# Patient Record
Sex: Male | Born: 1970 | Race: White | Hispanic: No
Health system: Southern US, Community
[De-identification: ages and names within clinical notes are randomized; demographics above are authoritative.]

---

## 2009-05-08 ENCOUNTER — Encounter: Admission: RE | Admit: 2009-05-08 | Discharge: 2009-05-08 | Payer: Self-pay | Admitting: Family Medicine

## 2010-09-02 IMAGING — CR DG ORBITS FOR FOREIGN BODY
2 series · 2 of 2 positions shown · non-contrast
Comparison: None

CLINICAL DATA: History of working around metal.  Screening exam
prior to a MRI scan.

ORBITS FOR FOREIGN BODY - 2 VIEW

[view not recorded (1 of 2)]
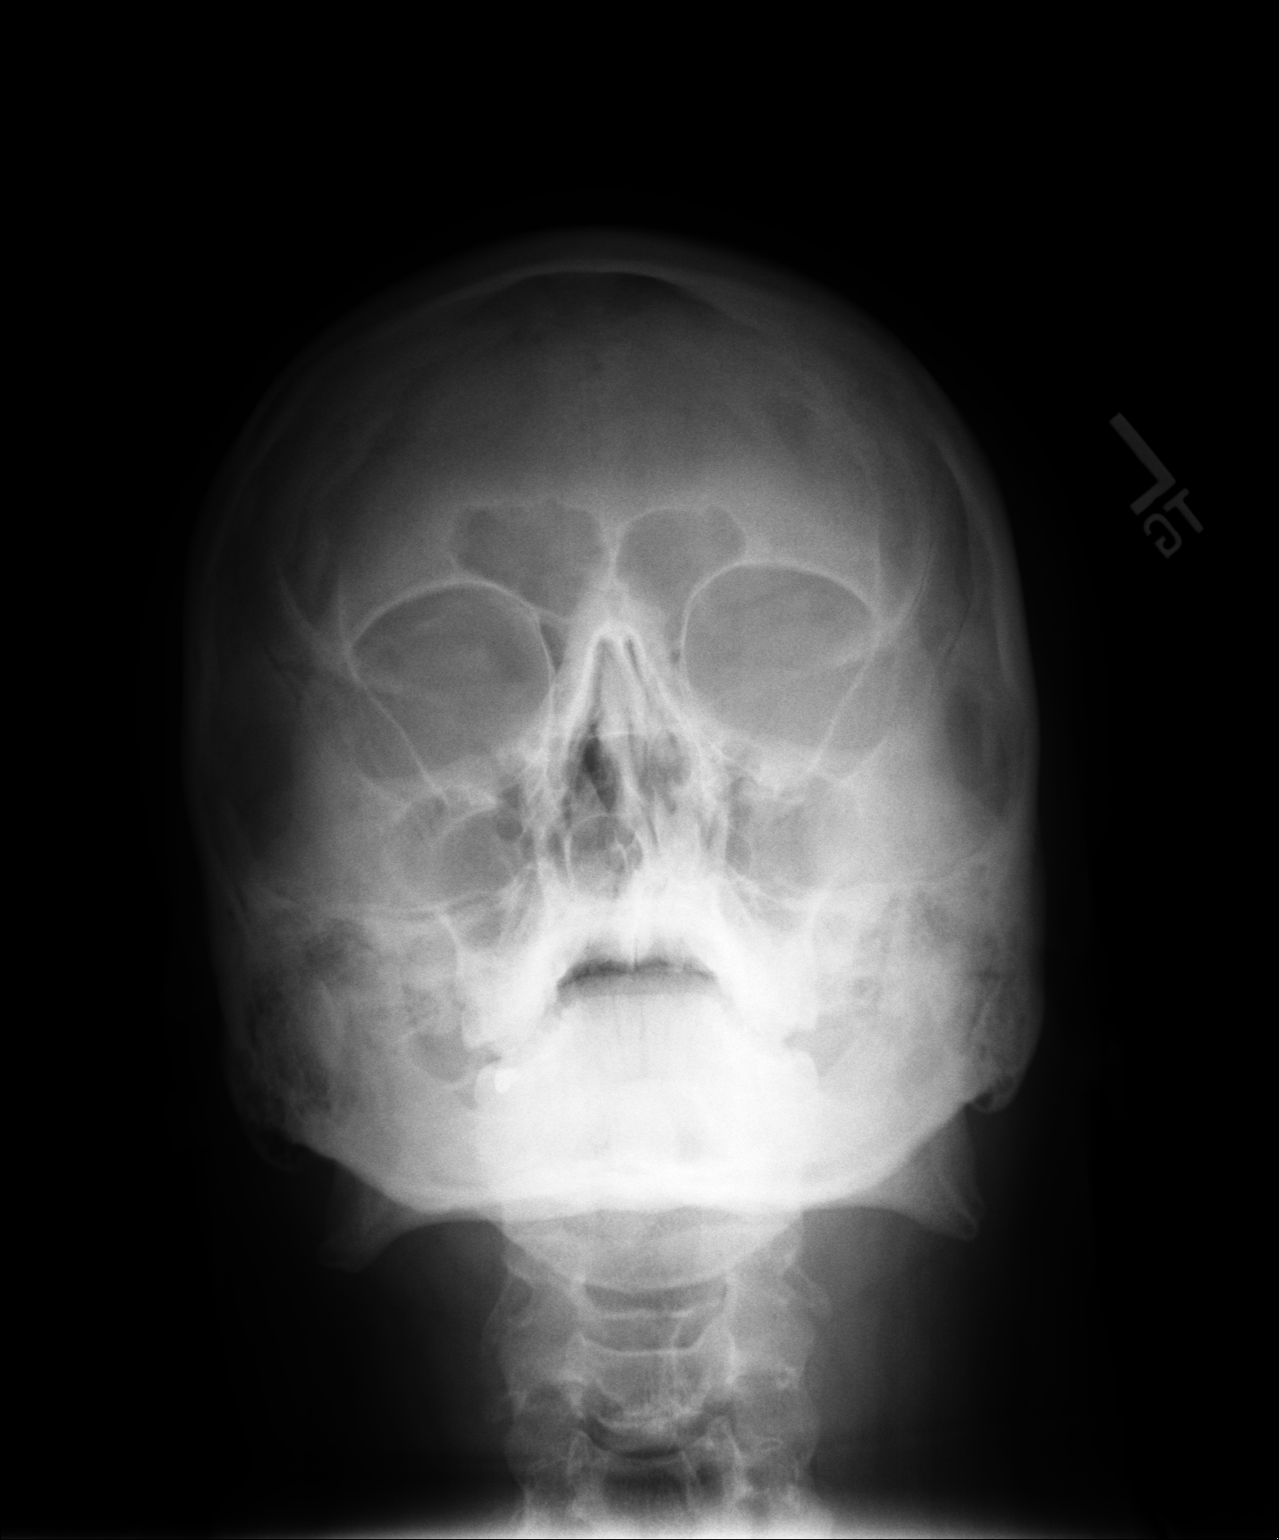

[view not recorded (2 of 2)]
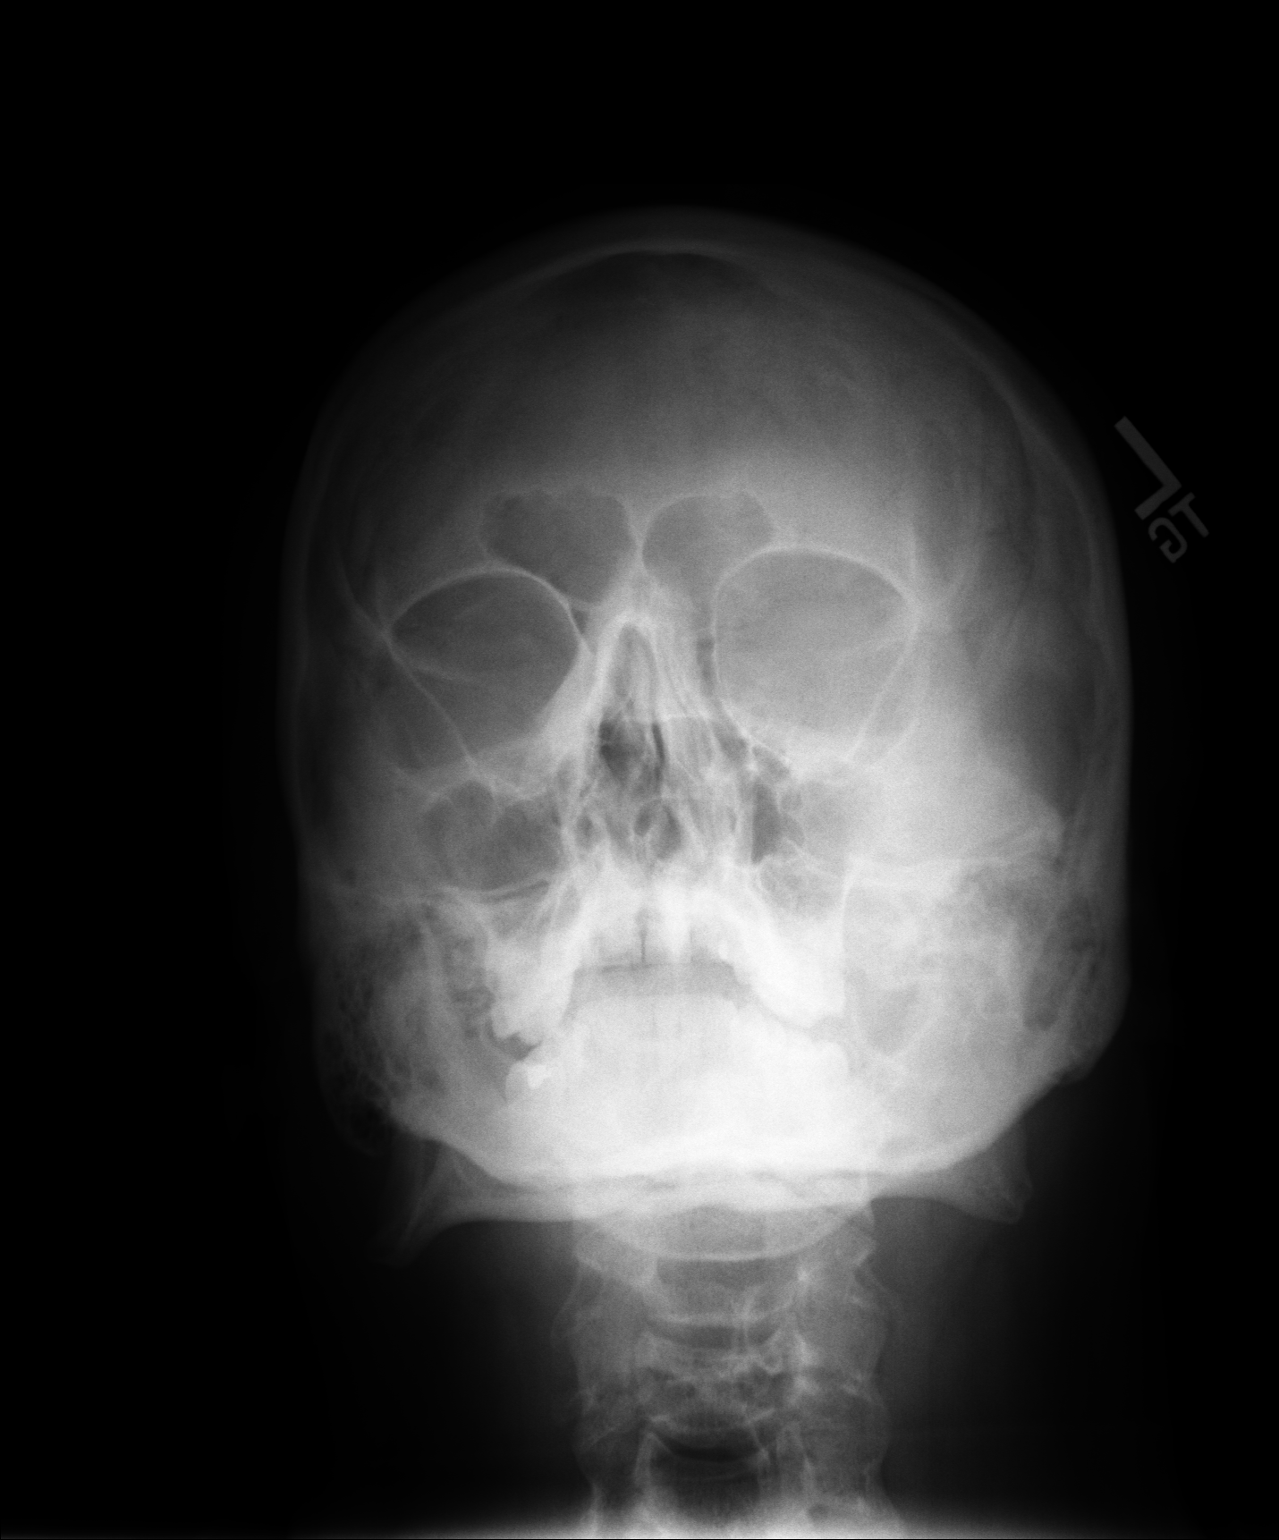

[2 of 2 positions shown; findings below may reference images not displayed]

FINDINGS: No evidence for metallic densities overlying the orbits.
There is increased opacification over the left maxillary sinus
compared to the right side which may represent underlying mucosal
disease.
IMPRESSION: Negative for a metallic foreign body.

Possible left maxillary sinus disease.

## 2014-11-15 ENCOUNTER — Emergency Department
Admission: EM | Admit: 2014-11-15 | Discharge: 2014-11-15 | Disposition: A | Discharge status: Home / Self Care | Payer: BC Managed Care – PPO | Source: Home / Self Care | Attending: Family Medicine | Admitting: Family Medicine

## 2014-11-15 ENCOUNTER — Emergency Department (INDEPENDENT_AMBULATORY_CARE_PROVIDER_SITE_OTHER): Payer: BC Managed Care – PPO

## 2014-11-15 DIAGNOSIS — S6992XA Unspecified injury of left wrist, hand and finger(s), initial encounter: Secondary | ICD-10-CM

## 2014-11-15 DIAGNOSIS — R52 Pain, unspecified: Secondary | ICD-10-CM

## 2014-11-15 DIAGNOSIS — M7989 Other specified soft tissue disorders: Secondary | ICD-10-CM

## 2014-11-15 MED ORDER — TETANUS-DIPHTH-ACELL PERTUSSIS 5-2.5-18.5 LF-MCG/0.5 IM SUSP
0.5000 mL | Freq: Once | INTRAMUSCULAR | Status: AC
  Administered 2014-11-15: 0.5 mL via INTRAMUSCULAR

## 2014-11-15 NOTE — ED Provider Notes (Signed)
Travis SongsterKarl Macias is a 43 y.o. male who presents to Urgent Care today for finger injury. Patient got his left ring finger ring caught on an object today results in an axial traction on his finger. He notes pain and swelling and a laceration. He is unable to remove the ring himself. The injury occurred today. He feels well otherwise. No radiating pain weakness or numbness. 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 He cannot recall his last tetanus vaccination.   No past medical history on file. No past surgical history on file. History  Substance Use Topics  . Smoking status: Not on file  . Smokeless tobacco: Not on file  . Alcohol Use: Not on file   ROS as above Medications: Current Facility-Administered Medications  Medication Dose Route Frequency Provider Last Rate Last Dose  . Tdap (BOOSTRIX) injection 0.5 mL  0.5 mL Intramuscular Once Rodolph BongEvan S Meleni Delahunt, MD       Current Outpatient Prescriptions  Medication Sig Dispense Refill  . AMLODIPINE BESYLATE PO Take by mouth daily.    Marland Kitchen. aspirin 81 MG tablet Take 81 mg by mouth daily.    . cetirizine (ZYRTEC) 10 MG chewable tablet Chew 10 mg by mouth daily.    . fenofibrate (TRICOR) 145 MG tablet Take 145 mg by mouth daily.    Marland Kitchen. losartan (COZAAR) 100 MG tablet Take 100 mg by mouth daily.    . niacin 500 MG tablet Take 500 mg by mouth at bedtime.     No Known Allergies   Exam:  BP 155/82 mmHg  Pulse 66  Temp(Src) 97.8 F (36.6 C) (Oral)  Wt 244 lb (110.678 kg)  SpO2 99% Gen: Well NAD Left hand: Laceration at the palmar aspect of the proximal phalanx of the fourth digit. Swollen proximal phalanx. The ring cannot be passed over the PIP.   The ring was cut on 2 sides using a ring cutter.  The ring was then removed.  No results found for this or any previous visit (from the past 24 hour(s)). Dg Finger Ring Left  11/15/2014   CLINICAL DATA:  Pain, hung from ring on ring finger 1 hr ago, swelling, little pain  EXAM: LEFT  RING FINGER 2+V  COMPARISON:  None  FINDINGS: Soft tissue swelling LEFT ring finger.  Osseous mineralization normal.  Joint spaces preserved.  No acute fracture, dislocation or bone destruction.  IMPRESSION: Soft tissue swelling without acute osseous abnormalities.   Electronically Signed   By: Ulyses SouthwardMark  Boles M.D.   On: 11/15/2014 17:40    Assessment and Plan: 43 y.o. male with laceration and contusion of the left fourth ring finger. Tetanus vaccination updated. Wound care instructions reviewed. Follow-up with PCP.  Discussed warning signs or symptoms. Please see discharge instructions. Patient expresses understanding.     Rodolph BongEvan S Kevin Mario, MD 11/15/14 [phone]

## 2014-11-15 NOTE — ED Notes (Signed)
Patient working on boat today, injured left ring finger, finger swelled and he was unable to remove wedding band

## 2014-11-15 NOTE — Discharge Instructions (Signed)
Thank you for coming in today. Keep the wound clean.  Come back as needed.  Follow-up with primary care provider as needed.

## 2016-03-11 IMAGING — CR DG FINGER RING 2+V*L*
2 series · 2 of 2 positions shown · non-contrast
Comparison: None

CLINICAL DATA: Pain, hung from ring on ring finger 1 hr ago,
swelling, little pain

EXAM:
LEFT RING FINGER 2+V

[view not recorded (1 of 2)]
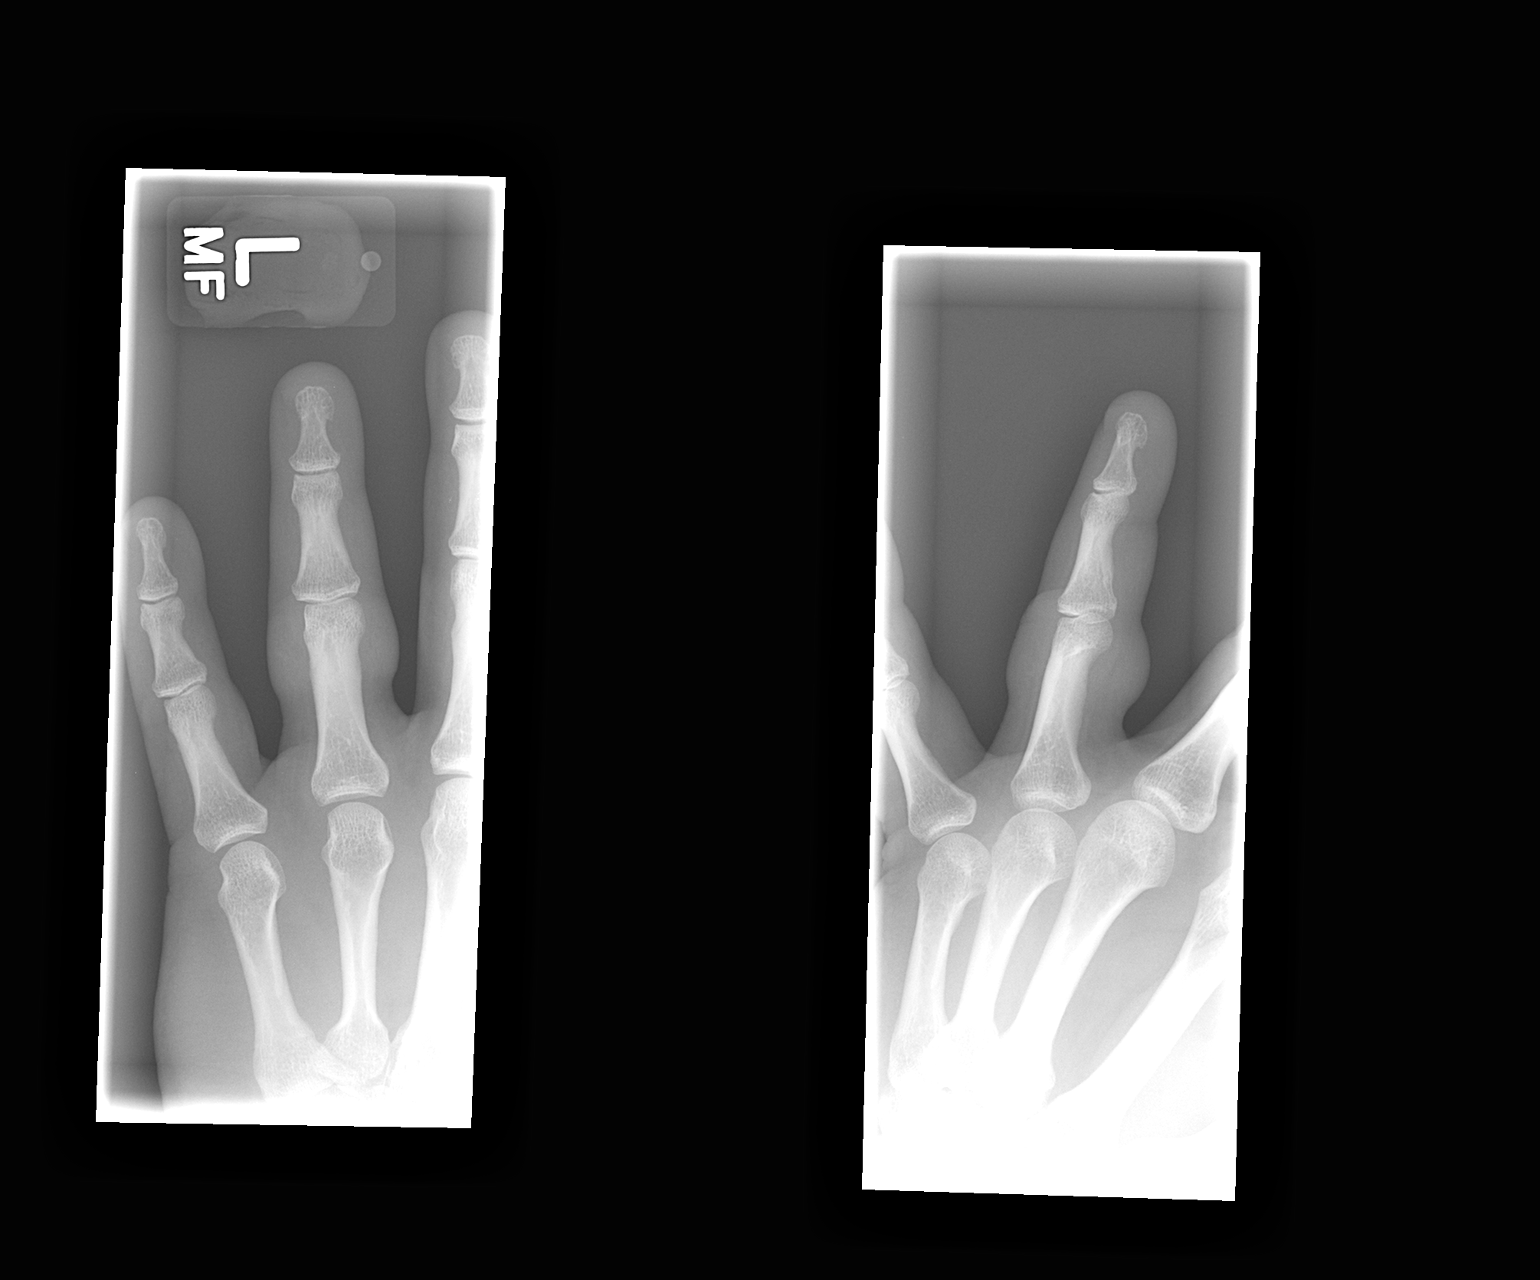

[view not recorded (2 of 2)]
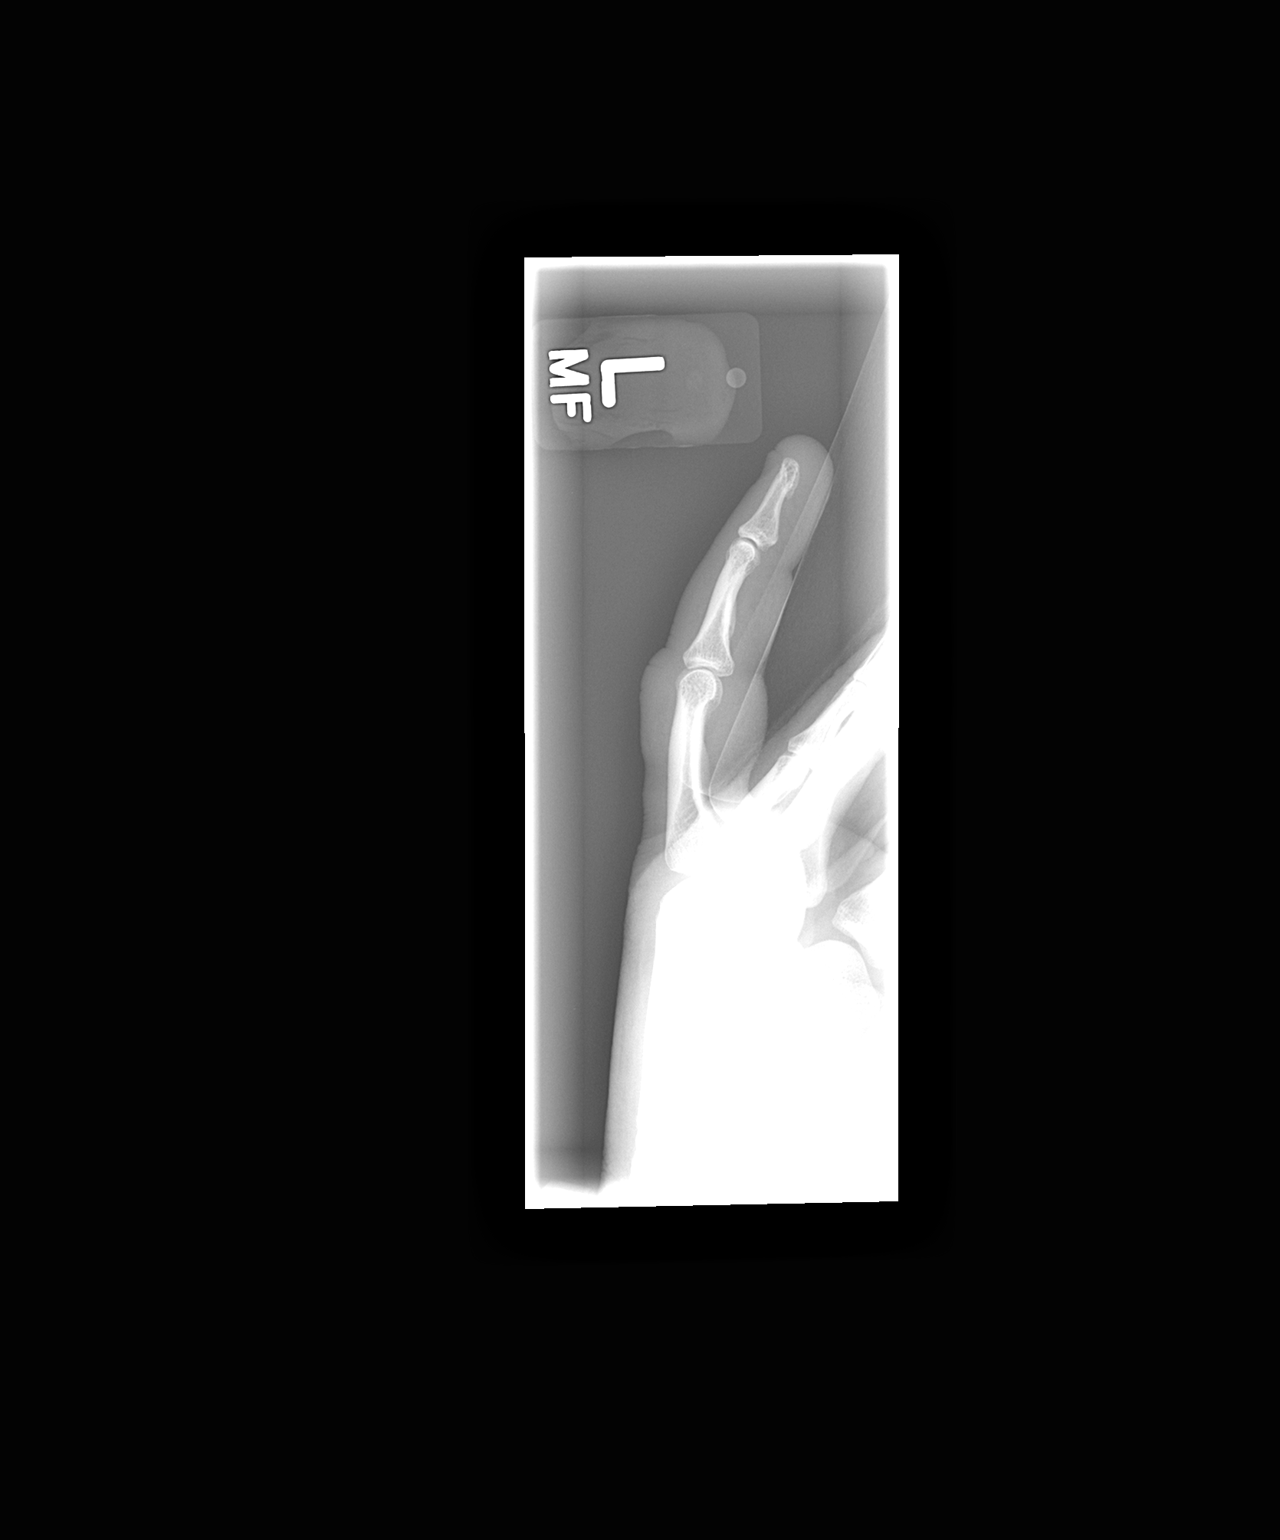

[2 of 2 positions shown; findings below may reference images not displayed]

FINDINGS: Soft tissue swelling LEFT ring finger.

Osseous mineralization normal.

Joint spaces preserved.

No acute fracture, dislocation or bone destruction.
IMPRESSION: Soft tissue swelling without acute osseous abnormalities.

## 2017-09-15 DIAGNOSIS — Z23 Encounter for immunization: Secondary | ICD-10-CM | POA: Diagnosis not present

## 2017-09-15 DIAGNOSIS — E781 Pure hyperglyceridemia: Secondary | ICD-10-CM | POA: Diagnosis not present

## 2017-09-15 DIAGNOSIS — I10 Essential (primary) hypertension: Secondary | ICD-10-CM | POA: Diagnosis not present

## 2017-10-17 DIAGNOSIS — J3081 Allergic rhinitis due to animal (cat) (dog) hair and dander: Secondary | ICD-10-CM | POA: Diagnosis not present

## 2017-10-17 DIAGNOSIS — J301 Allergic rhinitis due to pollen: Secondary | ICD-10-CM | POA: Diagnosis not present

## 2017-10-17 DIAGNOSIS — J4531 Mild persistent asthma with (acute) exacerbation: Secondary | ICD-10-CM | POA: Diagnosis not present

## 2017-10-17 DIAGNOSIS — J3089 Other allergic rhinitis: Secondary | ICD-10-CM | POA: Diagnosis not present

## 2017-10-27 DIAGNOSIS — J3081 Allergic rhinitis due to animal (cat) (dog) hair and dander: Secondary | ICD-10-CM | POA: Diagnosis not present

## 2017-10-27 DIAGNOSIS — J3089 Other allergic rhinitis: Secondary | ICD-10-CM | POA: Diagnosis not present

## 2017-10-27 DIAGNOSIS — J301 Allergic rhinitis due to pollen: Secondary | ICD-10-CM | POA: Diagnosis not present

## 2017-11-14 DIAGNOSIS — J301 Allergic rhinitis due to pollen: Secondary | ICD-10-CM | POA: Diagnosis not present

## 2017-11-14 DIAGNOSIS — J3089 Other allergic rhinitis: Secondary | ICD-10-CM | POA: Diagnosis not present

## 2017-11-14 DIAGNOSIS — J3081 Allergic rhinitis due to animal (cat) (dog) hair and dander: Secondary | ICD-10-CM | POA: Diagnosis not present

## 2017-11-20 DIAGNOSIS — J3081 Allergic rhinitis due to animal (cat) (dog) hair and dander: Secondary | ICD-10-CM | POA: Diagnosis not present

## 2017-11-20 DIAGNOSIS — J3089 Other allergic rhinitis: Secondary | ICD-10-CM | POA: Diagnosis not present

## 2017-11-20 DIAGNOSIS — J301 Allergic rhinitis due to pollen: Secondary | ICD-10-CM | POA: Diagnosis not present

## 2017-11-27 DIAGNOSIS — J4540 Moderate persistent asthma, uncomplicated: Secondary | ICD-10-CM | POA: Diagnosis not present

## 2017-11-27 DIAGNOSIS — J301 Allergic rhinitis due to pollen: Secondary | ICD-10-CM | POA: Diagnosis not present

## 2017-11-27 DIAGNOSIS — J3081 Allergic rhinitis due to animal (cat) (dog) hair and dander: Secondary | ICD-10-CM | POA: Diagnosis not present

## 2017-11-27 DIAGNOSIS — J3089 Other allergic rhinitis: Secondary | ICD-10-CM | POA: Diagnosis not present

## 2017-11-27 DIAGNOSIS — J4531 Mild persistent asthma with (acute) exacerbation: Secondary | ICD-10-CM | POA: Diagnosis not present

## 2017-11-27 DIAGNOSIS — H1013 Acute atopic conjunctivitis, bilateral: Secondary | ICD-10-CM | POA: Diagnosis not present

## 2017-12-12 DIAGNOSIS — J301 Allergic rhinitis due to pollen: Secondary | ICD-10-CM | POA: Diagnosis not present

## 2017-12-12 DIAGNOSIS — J3081 Allergic rhinitis due to animal (cat) (dog) hair and dander: Secondary | ICD-10-CM | POA: Diagnosis not present

## 2017-12-12 DIAGNOSIS — J3089 Other allergic rhinitis: Secondary | ICD-10-CM | POA: Diagnosis not present

## 2017-12-27 DIAGNOSIS — J301 Allergic rhinitis due to pollen: Secondary | ICD-10-CM | POA: Diagnosis not present

## 2017-12-27 DIAGNOSIS — J3089 Other allergic rhinitis: Secondary | ICD-10-CM | POA: Diagnosis not present

## 2017-12-27 DIAGNOSIS — J3081 Allergic rhinitis due to animal (cat) (dog) hair and dander: Secondary | ICD-10-CM | POA: Diagnosis not present

## 2018-01-09 DIAGNOSIS — J3089 Other allergic rhinitis: Secondary | ICD-10-CM | POA: Diagnosis not present

## 2018-01-09 DIAGNOSIS — J301 Allergic rhinitis due to pollen: Secondary | ICD-10-CM | POA: Diagnosis not present

## 2018-01-09 DIAGNOSIS — J3081 Allergic rhinitis due to animal (cat) (dog) hair and dander: Secondary | ICD-10-CM | POA: Diagnosis not present

## 2018-01-17 DIAGNOSIS — J3089 Other allergic rhinitis: Secondary | ICD-10-CM | POA: Diagnosis not present

## 2018-01-17 DIAGNOSIS — J301 Allergic rhinitis due to pollen: Secondary | ICD-10-CM | POA: Diagnosis not present

## 2018-01-17 DIAGNOSIS — J3081 Allergic rhinitis due to animal (cat) (dog) hair and dander: Secondary | ICD-10-CM | POA: Diagnosis not present

## 2018-01-22 DIAGNOSIS — J3081 Allergic rhinitis due to animal (cat) (dog) hair and dander: Secondary | ICD-10-CM | POA: Diagnosis not present

## 2018-01-22 DIAGNOSIS — J3089 Other allergic rhinitis: Secondary | ICD-10-CM | POA: Diagnosis not present

## 2018-01-22 DIAGNOSIS — J301 Allergic rhinitis due to pollen: Secondary | ICD-10-CM | POA: Diagnosis not present

## 2018-02-01 DIAGNOSIS — J301 Allergic rhinitis due to pollen: Secondary | ICD-10-CM | POA: Diagnosis not present

## 2018-02-01 DIAGNOSIS — J3089 Other allergic rhinitis: Secondary | ICD-10-CM | POA: Diagnosis not present

## 2018-02-01 DIAGNOSIS — J3081 Allergic rhinitis due to animal (cat) (dog) hair and dander: Secondary | ICD-10-CM | POA: Diagnosis not present

## 2018-02-13 DIAGNOSIS — J301 Allergic rhinitis due to pollen: Secondary | ICD-10-CM | POA: Diagnosis not present

## 2018-02-13 DIAGNOSIS — J3089 Other allergic rhinitis: Secondary | ICD-10-CM | POA: Diagnosis not present

## 2018-02-13 DIAGNOSIS — J3081 Allergic rhinitis due to animal (cat) (dog) hair and dander: Secondary | ICD-10-CM | POA: Diagnosis not present

## 2018-02-15 DIAGNOSIS — J3081 Allergic rhinitis due to animal (cat) (dog) hair and dander: Secondary | ICD-10-CM | POA: Diagnosis not present

## 2018-02-15 DIAGNOSIS — J301 Allergic rhinitis due to pollen: Secondary | ICD-10-CM | POA: Diagnosis not present

## 2018-02-15 DIAGNOSIS — J3089 Other allergic rhinitis: Secondary | ICD-10-CM | POA: Diagnosis not present

## 2018-02-19 DIAGNOSIS — J3081 Allergic rhinitis due to animal (cat) (dog) hair and dander: Secondary | ICD-10-CM | POA: Diagnosis not present

## 2018-02-19 DIAGNOSIS — J301 Allergic rhinitis due to pollen: Secondary | ICD-10-CM | POA: Diagnosis not present

## 2018-02-19 DIAGNOSIS — J3089 Other allergic rhinitis: Secondary | ICD-10-CM | POA: Diagnosis not present

## 2018-03-01 DIAGNOSIS — J301 Allergic rhinitis due to pollen: Secondary | ICD-10-CM | POA: Diagnosis not present

## 2018-03-01 DIAGNOSIS — J3081 Allergic rhinitis due to animal (cat) (dog) hair and dander: Secondary | ICD-10-CM | POA: Diagnosis not present

## 2018-03-01 DIAGNOSIS — J3089 Other allergic rhinitis: Secondary | ICD-10-CM | POA: Diagnosis not present

## 2018-03-08 DIAGNOSIS — J301 Allergic rhinitis due to pollen: Secondary | ICD-10-CM | POA: Diagnosis not present

## 2018-03-08 DIAGNOSIS — J3081 Allergic rhinitis due to animal (cat) (dog) hair and dander: Secondary | ICD-10-CM | POA: Diagnosis not present

## 2018-03-08 DIAGNOSIS — J3089 Other allergic rhinitis: Secondary | ICD-10-CM | POA: Diagnosis not present

## 2018-03-15 DIAGNOSIS — J301 Allergic rhinitis due to pollen: Secondary | ICD-10-CM | POA: Diagnosis not present

## 2018-03-15 DIAGNOSIS — J3081 Allergic rhinitis due to animal (cat) (dog) hair and dander: Secondary | ICD-10-CM | POA: Diagnosis not present

## 2018-03-15 DIAGNOSIS — J3089 Other allergic rhinitis: Secondary | ICD-10-CM | POA: Diagnosis not present

## 2018-03-22 DIAGNOSIS — J301 Allergic rhinitis due to pollen: Secondary | ICD-10-CM | POA: Diagnosis not present

## 2018-03-22 DIAGNOSIS — J3081 Allergic rhinitis due to animal (cat) (dog) hair and dander: Secondary | ICD-10-CM | POA: Diagnosis not present

## 2018-03-22 DIAGNOSIS — J3089 Other allergic rhinitis: Secondary | ICD-10-CM | POA: Diagnosis not present

## 2018-03-29 DIAGNOSIS — J3081 Allergic rhinitis due to animal (cat) (dog) hair and dander: Secondary | ICD-10-CM | POA: Diagnosis not present

## 2018-03-29 DIAGNOSIS — J3089 Other allergic rhinitis: Secondary | ICD-10-CM | POA: Diagnosis not present

## 2018-03-29 DIAGNOSIS — J301 Allergic rhinitis due to pollen: Secondary | ICD-10-CM | POA: Diagnosis not present

## 2018-04-05 DIAGNOSIS — J3089 Other allergic rhinitis: Secondary | ICD-10-CM | POA: Diagnosis not present

## 2018-04-05 DIAGNOSIS — J301 Allergic rhinitis due to pollen: Secondary | ICD-10-CM | POA: Diagnosis not present

## 2018-04-05 DIAGNOSIS — J3081 Allergic rhinitis due to animal (cat) (dog) hair and dander: Secondary | ICD-10-CM | POA: Diagnosis not present

## 2018-04-12 DIAGNOSIS — J301 Allergic rhinitis due to pollen: Secondary | ICD-10-CM | POA: Diagnosis not present

## 2018-04-12 DIAGNOSIS — J3081 Allergic rhinitis due to animal (cat) (dog) hair and dander: Secondary | ICD-10-CM | POA: Diagnosis not present

## 2018-04-12 DIAGNOSIS — J3089 Other allergic rhinitis: Secondary | ICD-10-CM | POA: Diagnosis not present

## 2018-04-24 DIAGNOSIS — J3089 Other allergic rhinitis: Secondary | ICD-10-CM | POA: Diagnosis not present

## 2018-04-24 DIAGNOSIS — J301 Allergic rhinitis due to pollen: Secondary | ICD-10-CM | POA: Diagnosis not present

## 2018-04-24 DIAGNOSIS — J3081 Allergic rhinitis due to animal (cat) (dog) hair and dander: Secondary | ICD-10-CM | POA: Diagnosis not present

## 2018-05-03 DIAGNOSIS — J3089 Other allergic rhinitis: Secondary | ICD-10-CM | POA: Diagnosis not present

## 2018-05-03 DIAGNOSIS — J3081 Allergic rhinitis due to animal (cat) (dog) hair and dander: Secondary | ICD-10-CM | POA: Diagnosis not present

## 2018-05-03 DIAGNOSIS — J301 Allergic rhinitis due to pollen: Secondary | ICD-10-CM | POA: Diagnosis not present

## 2018-05-10 DIAGNOSIS — J301 Allergic rhinitis due to pollen: Secondary | ICD-10-CM | POA: Diagnosis not present

## 2018-05-10 DIAGNOSIS — J3089 Other allergic rhinitis: Secondary | ICD-10-CM | POA: Diagnosis not present

## 2018-05-10 DIAGNOSIS — J3081 Allergic rhinitis due to animal (cat) (dog) hair and dander: Secondary | ICD-10-CM | POA: Diagnosis not present

## 2018-05-22 DIAGNOSIS — J3081 Allergic rhinitis due to animal (cat) (dog) hair and dander: Secondary | ICD-10-CM | POA: Diagnosis not present

## 2018-05-22 DIAGNOSIS — J301 Allergic rhinitis due to pollen: Secondary | ICD-10-CM | POA: Diagnosis not present

## 2018-05-22 DIAGNOSIS — J3089 Other allergic rhinitis: Secondary | ICD-10-CM | POA: Diagnosis not present

## 2018-05-30 DIAGNOSIS — J3089 Other allergic rhinitis: Secondary | ICD-10-CM | POA: Diagnosis not present

## 2018-05-30 DIAGNOSIS — J3081 Allergic rhinitis due to animal (cat) (dog) hair and dander: Secondary | ICD-10-CM | POA: Diagnosis not present

## 2018-05-30 DIAGNOSIS — J301 Allergic rhinitis due to pollen: Secondary | ICD-10-CM | POA: Diagnosis not present

## 2018-06-04 DIAGNOSIS — J3089 Other allergic rhinitis: Secondary | ICD-10-CM | POA: Diagnosis not present

## 2018-06-04 DIAGNOSIS — J3081 Allergic rhinitis due to animal (cat) (dog) hair and dander: Secondary | ICD-10-CM | POA: Diagnosis not present

## 2018-06-04 DIAGNOSIS — J301 Allergic rhinitis due to pollen: Secondary | ICD-10-CM | POA: Diagnosis not present

## 2018-06-13 DIAGNOSIS — J3081 Allergic rhinitis due to animal (cat) (dog) hair and dander: Secondary | ICD-10-CM | POA: Diagnosis not present

## 2018-06-13 DIAGNOSIS — J301 Allergic rhinitis due to pollen: Secondary | ICD-10-CM | POA: Diagnosis not present

## 2018-06-13 DIAGNOSIS — J3089 Other allergic rhinitis: Secondary | ICD-10-CM | POA: Diagnosis not present

## 2018-06-20 DIAGNOSIS — J3081 Allergic rhinitis due to animal (cat) (dog) hair and dander: Secondary | ICD-10-CM | POA: Diagnosis not present

## 2018-06-20 DIAGNOSIS — J301 Allergic rhinitis due to pollen: Secondary | ICD-10-CM | POA: Diagnosis not present

## 2018-06-20 DIAGNOSIS — J3089 Other allergic rhinitis: Secondary | ICD-10-CM | POA: Diagnosis not present

## 2018-06-26 DIAGNOSIS — J3081 Allergic rhinitis due to animal (cat) (dog) hair and dander: Secondary | ICD-10-CM | POA: Diagnosis not present

## 2018-06-26 DIAGNOSIS — J301 Allergic rhinitis due to pollen: Secondary | ICD-10-CM | POA: Diagnosis not present

## 2018-06-26 DIAGNOSIS — J3089 Other allergic rhinitis: Secondary | ICD-10-CM | POA: Diagnosis not present

## 2018-07-04 DIAGNOSIS — J3081 Allergic rhinitis due to animal (cat) (dog) hair and dander: Secondary | ICD-10-CM | POA: Diagnosis not present

## 2018-07-04 DIAGNOSIS — J301 Allergic rhinitis due to pollen: Secondary | ICD-10-CM | POA: Diagnosis not present

## 2018-07-04 DIAGNOSIS — M25511 Pain in right shoulder: Secondary | ICD-10-CM | POA: Diagnosis not present

## 2018-07-04 DIAGNOSIS — J3089 Other allergic rhinitis: Secondary | ICD-10-CM | POA: Diagnosis not present

## 2018-07-19 DIAGNOSIS — J3081 Allergic rhinitis due to animal (cat) (dog) hair and dander: Secondary | ICD-10-CM | POA: Diagnosis not present

## 2018-07-19 DIAGNOSIS — J3089 Other allergic rhinitis: Secondary | ICD-10-CM | POA: Diagnosis not present

## 2018-07-19 DIAGNOSIS — J301 Allergic rhinitis due to pollen: Secondary | ICD-10-CM | POA: Diagnosis not present

## 2018-07-25 DIAGNOSIS — J3089 Other allergic rhinitis: Secondary | ICD-10-CM | POA: Diagnosis not present

## 2018-07-25 DIAGNOSIS — J3081 Allergic rhinitis due to animal (cat) (dog) hair and dander: Secondary | ICD-10-CM | POA: Diagnosis not present

## 2018-07-25 DIAGNOSIS — J301 Allergic rhinitis due to pollen: Secondary | ICD-10-CM | POA: Diagnosis not present

## 2018-08-02 DIAGNOSIS — J3089 Other allergic rhinitis: Secondary | ICD-10-CM | POA: Diagnosis not present

## 2018-08-02 DIAGNOSIS — J3081 Allergic rhinitis due to animal (cat) (dog) hair and dander: Secondary | ICD-10-CM | POA: Diagnosis not present

## 2018-08-02 DIAGNOSIS — J301 Allergic rhinitis due to pollen: Secondary | ICD-10-CM | POA: Diagnosis not present

## 2018-08-16 DIAGNOSIS — J301 Allergic rhinitis due to pollen: Secondary | ICD-10-CM | POA: Diagnosis not present

## 2018-08-16 DIAGNOSIS — J3089 Other allergic rhinitis: Secondary | ICD-10-CM | POA: Diagnosis not present

## 2018-08-16 DIAGNOSIS — J3081 Allergic rhinitis due to animal (cat) (dog) hair and dander: Secondary | ICD-10-CM | POA: Diagnosis not present

## 2018-08-21 DIAGNOSIS — J3089 Other allergic rhinitis: Secondary | ICD-10-CM | POA: Diagnosis not present

## 2018-08-21 DIAGNOSIS — J301 Allergic rhinitis due to pollen: Secondary | ICD-10-CM | POA: Diagnosis not present

## 2018-08-21 DIAGNOSIS — J3081 Allergic rhinitis due to animal (cat) (dog) hair and dander: Secondary | ICD-10-CM | POA: Diagnosis not present

## 2018-08-30 DIAGNOSIS — J3081 Allergic rhinitis due to animal (cat) (dog) hair and dander: Secondary | ICD-10-CM | POA: Diagnosis not present

## 2018-08-30 DIAGNOSIS — J301 Allergic rhinitis due to pollen: Secondary | ICD-10-CM | POA: Diagnosis not present

## 2018-08-30 DIAGNOSIS — J3089 Other allergic rhinitis: Secondary | ICD-10-CM | POA: Diagnosis not present

## 2018-09-06 DIAGNOSIS — J3081 Allergic rhinitis due to animal (cat) (dog) hair and dander: Secondary | ICD-10-CM | POA: Diagnosis not present

## 2018-09-06 DIAGNOSIS — J3089 Other allergic rhinitis: Secondary | ICD-10-CM | POA: Diagnosis not present

## 2018-09-06 DIAGNOSIS — J301 Allergic rhinitis due to pollen: Secondary | ICD-10-CM | POA: Diagnosis not present

## 2018-09-13 DIAGNOSIS — J3089 Other allergic rhinitis: Secondary | ICD-10-CM | POA: Diagnosis not present

## 2018-09-13 DIAGNOSIS — J301 Allergic rhinitis due to pollen: Secondary | ICD-10-CM | POA: Diagnosis not present

## 2018-09-13 DIAGNOSIS — J3081 Allergic rhinitis due to animal (cat) (dog) hair and dander: Secondary | ICD-10-CM | POA: Diagnosis not present

## 2018-09-21 DIAGNOSIS — Z23 Encounter for immunization: Secondary | ICD-10-CM | POA: Diagnosis not present

## 2018-09-21 DIAGNOSIS — I10 Essential (primary) hypertension: Secondary | ICD-10-CM | POA: Diagnosis not present

## 2018-09-21 DIAGNOSIS — E781 Pure hyperglyceridemia: Secondary | ICD-10-CM | POA: Diagnosis not present

## 2018-09-27 DIAGNOSIS — J301 Allergic rhinitis due to pollen: Secondary | ICD-10-CM | POA: Diagnosis not present

## 2018-09-27 DIAGNOSIS — J3081 Allergic rhinitis due to animal (cat) (dog) hair and dander: Secondary | ICD-10-CM | POA: Diagnosis not present

## 2018-09-27 DIAGNOSIS — J3089 Other allergic rhinitis: Secondary | ICD-10-CM | POA: Diagnosis not present

## 2018-10-11 DIAGNOSIS — J3081 Allergic rhinitis due to animal (cat) (dog) hair and dander: Secondary | ICD-10-CM | POA: Diagnosis not present

## 2018-10-11 DIAGNOSIS — J301 Allergic rhinitis due to pollen: Secondary | ICD-10-CM | POA: Diagnosis not present

## 2018-10-11 DIAGNOSIS — J3089 Other allergic rhinitis: Secondary | ICD-10-CM | POA: Diagnosis not present

## 2018-10-21 DIAGNOSIS — J301 Allergic rhinitis due to pollen: Secondary | ICD-10-CM | POA: Diagnosis not present

## 2018-10-21 DIAGNOSIS — J3089 Other allergic rhinitis: Secondary | ICD-10-CM | POA: Diagnosis not present

## 2018-10-21 DIAGNOSIS — J3081 Allergic rhinitis due to animal (cat) (dog) hair and dander: Secondary | ICD-10-CM | POA: Diagnosis not present

## 2018-10-25 DIAGNOSIS — J3089 Other allergic rhinitis: Secondary | ICD-10-CM | POA: Diagnosis not present

## 2018-10-25 DIAGNOSIS — J301 Allergic rhinitis due to pollen: Secondary | ICD-10-CM | POA: Diagnosis not present

## 2018-10-25 DIAGNOSIS — J3081 Allergic rhinitis due to animal (cat) (dog) hair and dander: Secondary | ICD-10-CM | POA: Diagnosis not present

## 2018-11-08 DIAGNOSIS — J3081 Allergic rhinitis due to animal (cat) (dog) hair and dander: Secondary | ICD-10-CM | POA: Diagnosis not present

## 2018-11-08 DIAGNOSIS — J301 Allergic rhinitis due to pollen: Secondary | ICD-10-CM | POA: Diagnosis not present

## 2018-11-08 DIAGNOSIS — J3089 Other allergic rhinitis: Secondary | ICD-10-CM | POA: Diagnosis not present

## 2018-11-16 DIAGNOSIS — L309 Dermatitis, unspecified: Secondary | ICD-10-CM | POA: Diagnosis not present

## 2018-11-16 DIAGNOSIS — N529 Male erectile dysfunction, unspecified: Secondary | ICD-10-CM | POA: Diagnosis not present

## 2018-11-19 DIAGNOSIS — J301 Allergic rhinitis due to pollen: Secondary | ICD-10-CM | POA: Diagnosis not present

## 2018-11-19 DIAGNOSIS — J3089 Other allergic rhinitis: Secondary | ICD-10-CM | POA: Diagnosis not present

## 2018-11-19 DIAGNOSIS — J3081 Allergic rhinitis due to animal (cat) (dog) hair and dander: Secondary | ICD-10-CM | POA: Diagnosis not present

## 2018-11-29 DIAGNOSIS — J0140 Acute pansinusitis, unspecified: Secondary | ICD-10-CM | POA: Diagnosis not present

## 2018-11-29 DIAGNOSIS — I10 Essential (primary) hypertension: Secondary | ICD-10-CM | POA: Diagnosis not present

## 2018-12-05 DIAGNOSIS — J301 Allergic rhinitis due to pollen: Secondary | ICD-10-CM | POA: Diagnosis not present

## 2018-12-05 DIAGNOSIS — J3081 Allergic rhinitis due to animal (cat) (dog) hair and dander: Secondary | ICD-10-CM | POA: Diagnosis not present

## 2018-12-05 DIAGNOSIS — J3089 Other allergic rhinitis: Secondary | ICD-10-CM | POA: Diagnosis not present

## 2018-12-31 DIAGNOSIS — J301 Allergic rhinitis due to pollen: Secondary | ICD-10-CM | POA: Diagnosis not present

## 2018-12-31 DIAGNOSIS — J3081 Allergic rhinitis due to animal (cat) (dog) hair and dander: Secondary | ICD-10-CM | POA: Diagnosis not present

## 2018-12-31 DIAGNOSIS — J3089 Other allergic rhinitis: Secondary | ICD-10-CM | POA: Diagnosis not present

## 2019-01-16 DIAGNOSIS — J301 Allergic rhinitis due to pollen: Secondary | ICD-10-CM | POA: Diagnosis not present

## 2019-01-16 DIAGNOSIS — J3089 Other allergic rhinitis: Secondary | ICD-10-CM | POA: Diagnosis not present

## 2019-01-16 DIAGNOSIS — J3081 Allergic rhinitis due to animal (cat) (dog) hair and dander: Secondary | ICD-10-CM | POA: Diagnosis not present

## 2019-01-31 DIAGNOSIS — J3081 Allergic rhinitis due to animal (cat) (dog) hair and dander: Secondary | ICD-10-CM | POA: Diagnosis not present

## 2019-01-31 DIAGNOSIS — J3089 Other allergic rhinitis: Secondary | ICD-10-CM | POA: Diagnosis not present

## 2019-01-31 DIAGNOSIS — J301 Allergic rhinitis due to pollen: Secondary | ICD-10-CM | POA: Diagnosis not present

## 2019-02-19 DIAGNOSIS — Z719 Counseling, unspecified: Secondary | ICD-10-CM | POA: Diagnosis not present

## 2019-02-20 DIAGNOSIS — J301 Allergic rhinitis due to pollen: Secondary | ICD-10-CM | POA: Diagnosis not present

## 2019-02-20 DIAGNOSIS — J3089 Other allergic rhinitis: Secondary | ICD-10-CM | POA: Diagnosis not present

## 2019-02-20 DIAGNOSIS — J3081 Allergic rhinitis due to animal (cat) (dog) hair and dander: Secondary | ICD-10-CM | POA: Diagnosis not present

## 2019-02-25 DIAGNOSIS — Z719 Counseling, unspecified: Secondary | ICD-10-CM | POA: Diagnosis not present

## 2019-03-04 DIAGNOSIS — Z719 Counseling, unspecified: Secondary | ICD-10-CM | POA: Diagnosis not present

## 2019-03-06 DIAGNOSIS — J3089 Other allergic rhinitis: Secondary | ICD-10-CM | POA: Diagnosis not present

## 2019-03-06 DIAGNOSIS — J3081 Allergic rhinitis due to animal (cat) (dog) hair and dander: Secondary | ICD-10-CM | POA: Diagnosis not present

## 2019-03-06 DIAGNOSIS — J301 Allergic rhinitis due to pollen: Secondary | ICD-10-CM | POA: Diagnosis not present

## 2019-03-11 DIAGNOSIS — Z719 Counseling, unspecified: Secondary | ICD-10-CM | POA: Diagnosis not present

## 2019-03-18 DIAGNOSIS — Z719 Counseling, unspecified: Secondary | ICD-10-CM | POA: Diagnosis not present

## 2019-04-02 DIAGNOSIS — J301 Allergic rhinitis due to pollen: Secondary | ICD-10-CM | POA: Diagnosis not present

## 2019-04-02 DIAGNOSIS — J3089 Other allergic rhinitis: Secondary | ICD-10-CM | POA: Diagnosis not present

## 2019-04-02 DIAGNOSIS — J3081 Allergic rhinitis due to animal (cat) (dog) hair and dander: Secondary | ICD-10-CM | POA: Diagnosis not present

## 2019-04-06 DIAGNOSIS — J3081 Allergic rhinitis due to animal (cat) (dog) hair and dander: Secondary | ICD-10-CM | POA: Diagnosis not present

## 2019-04-06 DIAGNOSIS — J3089 Other allergic rhinitis: Secondary | ICD-10-CM | POA: Diagnosis not present

## 2019-04-06 DIAGNOSIS — J301 Allergic rhinitis due to pollen: Secondary | ICD-10-CM | POA: Diagnosis not present

## 2019-04-30 DIAGNOSIS — J301 Allergic rhinitis due to pollen: Secondary | ICD-10-CM | POA: Diagnosis not present

## 2019-04-30 DIAGNOSIS — J3089 Other allergic rhinitis: Secondary | ICD-10-CM | POA: Diagnosis not present

## 2019-04-30 DIAGNOSIS — J3081 Allergic rhinitis due to animal (cat) (dog) hair and dander: Secondary | ICD-10-CM | POA: Diagnosis not present

## 2020-03-09 ENCOUNTER — Ambulatory Visit: Payer: Self-pay | Attending: Internal Medicine

## 2020-03-09 DIAGNOSIS — Z23 Encounter for immunization: Secondary | ICD-10-CM

## 2020-03-09 NOTE — Progress Notes (Signed)
   Covid-19 Vaccination Clinic  Name:  Lilton Pare    MRN: [id number] DOB: Dec 05, 1971  03/09/2020  Mr. Forness was observed post Covid-19 immunization for 15 minutes without incident. He was provided with Vaccine Information Sheet and instruction to access the V-Safe system.   Mr. Yaffe was instructed to call 911 with any severe reactions post vaccine: Marland Kitchen Difficulty breathing  . Swelling of face and throat  . A fast heartbeat  . A bad rash all over body  . Dizziness and weakness   Immunizations Administered    Name Date Dose VIS Date Route   Pfizer COVID-19 Vaccine 03/09/2020  3:35 PM 0.3 mL 12/06/2019 Intramuscular   Manufacturer: [employer], [employer]   Lot: [id number]   NDC: [address]-6

## 2020-03-31 ENCOUNTER — Ambulatory Visit: Payer: Self-pay | Attending: Internal Medicine

## 2020-03-31 ENCOUNTER — Ambulatory Visit: Payer: Self-pay

## 2020-03-31 DIAGNOSIS — Z23 Encounter for immunization: Secondary | ICD-10-CM

## 2020-03-31 NOTE — Progress Notes (Signed)
   Covid-19 Vaccination Clinic  Name:  Travis Macias    MRN: [id number] DOB: 1971/10/15  03/31/2020  [name] was observed post Covid-19 immunization for 15 minutes without incident. He was provided with Vaccine Information Sheet and instruction to access the V-Safe system.   [name] was instructed to call 911 with any severe reactions post vaccine: Marland Kitchen Difficulty breathing  . Swelling of face and throat  . A fast heartbeat  . A bad rash all over body  . Dizziness and weakness   Immunizations Administered    Name Date Dose VIS Date Route   Pfizer COVID-19 Vaccine 03/31/2020  9:52 AM 0.3 mL 12/06/2019 Intramuscular   Manufacturer: [employer], [employer]   Lot: [id number]   NDC: [address]-9

## 2021-04-18 ENCOUNTER — Telehealth: Payer: Self-pay | Admitting: Infectious Diseases

## 2021-04-18 DIAGNOSIS — U071 COVID-19: Secondary | ICD-10-CM

## 2021-04-18 MED ORDER — NIRMATRELVIR/RITONAVIR (PAXLOVID)TABLET: 3.0000 | ORAL_TABLET | Freq: Two times a day (BID) | ORAL | 0 refills | Status: AC

## 2021-04-18 NOTE — Telephone Encounter (Signed)
Called to discuss with patient about COVID-19 symptoms and the use of one of the available treatments for those with mild to moderate Covid symptoms and at a high risk of hospitalization.  Pt appears to qualify for outpatient treatment due to co-morbid conditions and/or a member of an at-risk group in accordance with the FDA Emergency Use Authorization.    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 no Qualifiers: Asthma, BMI > 35, HTN  NIH Criteria: 4   Rexene Alberts, NP    Outpatient Oral COVID Treatment Note  I connected with Travis Macias on 04/18/2021/12:14 PM by telephone and verified that I am speaking with the correct person using two identifiers.  I discussed the limitations, risks, security, and privacy concerns of performing an evaluation and management service by telephone and the availability of in person appointments. I also discussed with the patient that there may be a patient responsible charge related to this service. The patient expressed understanding and agreed to proceed.  Patient location: [facility]  Provider location: [facility]   Diagnosis: COVID-19 infection  Purpose of visit: Discussion of potential use of Molnupiravir or Paxlovid, a new treatment for mild to moderate COVID-19 viral infection in non-hospitalized patients.   Subjective: Patient is a 50 y.o. male who has been diagnosed with COVID 19 viral infection.  Their symptoms began on 04/15/2021 with URI symptoms.    Medical history - asthma, Obesity with BMI > 35, HTN  No Known Allergies   Current Outpatient Medications:  .  nirmatrelvir/ritonavir EUA (PAXLOVID) TABS, Take 3 tablets by mouth 2 (two) times daily for 5 days. Take nirmatrelvir (150 mg) 2 tablet(s) twice daily for 5 days and ritonavir (100 mg) one tablet twice daily for 5 days., Disp: 30 tablet, Rfl: 0 .  AMLODIPINE BESYLATE PO, Take by mouth daily., Disp: , Rfl:  .  aspirin 81 MG tablet,  Take 81 mg by mouth daily., Disp: , Rfl:  .  cetirizine (ZYRTEC) 10 MG chewable tablet, Chew 10 mg by mouth daily., Disp: , Rfl:  .  fenofibrate (TRICOR) 145 MG tablet, Take 145 mg by mouth daily., Disp: , Rfl:  .  losartan (COZAAR) 100 MG tablet, Take 100 mg by mouth daily., Disp: , Rfl:  .  niacin 500 MG tablet, Take 500 mg by mouth at bedtime., Disp: , Rfl:   Objective: Patient appears/sounds mildly ill.  They are in no apparent distress.  Breathing is non labored.  Mood and behavior are normal.  Laboratory Data:    Assessment: 50 y.o. male with mild/moderate COVID 19 viral infection diagnosed on 4/22 at high risk for progression to severe COVID 19.  Plan:  This patient is a 50 y.o. male that meets the following criteria for Emergency Use Authorization of: Molnupiravir  1. Age >18 yr 2. SARS-COV-2 positive test 3. Symptom onset < 5 days 4. Mild-to-moderate COVID disease with high risk for severe progression to hospitalization or death   I have spoken and communicated the following to the patient or parent/caregiver regarding: 1. Paxlovid is an unapproved drug that is authorized for use under an Emergency Use Authorization.  2. There are no adequate, approved, available products for the treatment of COVID-19 in adults who have mild-to-moderate COVID-19 and are at high risk for progressing to severe COVID-19, including hospitalization or death. 3. Other therapeutics are currently authorized. For additional information on all products authorized for treatment or prevention of COVID-19, please see [email or web address]-[email or web address]  4. There are  benefits and risks of taking this treatment as outlined in the "Fact Sheet for Patients and Caregivers."  5. "Fact Sheet for Patients and Caregivers" was reviewed with patient. A hard copy will be provided to patient from pharmacy prior to the patient receiving  treatment. 6. Patients should continue to self-isolate and use infection control measures (e.g., wear mask, isolate, social distance, avoid sharing personal items, clean and disinfect "high touch" surfaces, and frequent handwashing) according to CDC guidelines.  7. The patient or parent/caregiver has the option to accept or refuse treatment. 8. Patient medication history was reviewed for potential drug interactions:Interaction with home meds: Sildenafil - patient will hold  9. Patient's GFR was calculated to be 88 ml/min, and they were therefore prescribed Normal dose (GFR>60) - nirmatrelvir 150mg  tab (2 tablet) by mouth twice daily AND ritonavir 100mg  tab (1 tablet) by mouth twice daily   After reviewing above information with the patient, the patient agrees to receive Paxlovid.  Follow up instructions:    . Take prescription BID x 5 days as directed . Reach out to pharmacist for counseling on medication if desired . For concerns regarding further COVID symptoms please follow up with your PCP or urgent care . For urgent or life-threatening issues, seek care at your local emergency department  The patient was provided an opportunity to ask questions, and all were answered. The patient agreed with the plan and demonstrated an understanding of the instructions.   [facility] based on [facility] treatment locator.   The patient was advised to call their PCP or seek an in-person evaluation if the symptoms worsen or if the condition fails to improve as anticipated.   I provided 12 minutes of non face-to-face telephone visit time during this encounter, and > 50% was spent counseling as documented under my assessment & plan.  [facility], NP 04/18/2021 /12:14 PM

## 2021-09-21 ENCOUNTER — Ambulatory Visit: Payer: Self-pay | Attending: Internal Medicine

## 2021-09-21 ENCOUNTER — Other Ambulatory Visit (HOSPITAL_BASED_OUTPATIENT_CLINIC_OR_DEPARTMENT_OTHER): Payer: Self-pay

## 2021-09-21 DIAGNOSIS — Z23 Encounter for immunization: Secondary | ICD-10-CM

## 2021-09-21 MED ORDER — INFLUENZA VAC SPLIT QUAD 0.5 ML IM SUSY
PREFILLED_SYRINGE | INTRAMUSCULAR | 0 refills | Status: AC
  Filled 2021-09-21: qty 0.5, 1d supply, fill #0

## 2021-09-21 NOTE — Progress Notes (Signed)
   Covid-19 Vaccination Clinic  Name:  Travis Macias    MRN: [id number] DOB: Nov 20, 1971  09/21/2021  Mr. Dangerfield was observed post Covid-19 immunization for 15 minutes without incident. He was provided with Vaccine Information Sheet and instruction to access the V-Safe system.   Mr. Mathey was instructed to call 911 with any severe reactions post vaccine: Difficulty breathing  Swelling of face and throat  A fast heartbeat  A bad rash all over body  Dizziness and weakness

## 2021-10-01 ENCOUNTER — Other Ambulatory Visit (HOSPITAL_BASED_OUTPATIENT_CLINIC_OR_DEPARTMENT_OTHER): Payer: Self-pay

## 2021-10-01 MED ORDER — COVID-19MRNA BIVAL VACC PFIZER 30 MCG/0.3ML IM SUSP
INTRAMUSCULAR | 0 refills | Status: AC
  Filled 2021-10-01: qty 0.3, 1d supply, fill #0

## 2024-11-14 ENCOUNTER — Other Ambulatory Visit: Payer: Self-pay | Admitting: Family Medicine

## 2024-11-14 DIAGNOSIS — E785 Hyperlipidemia, unspecified: Secondary | ICD-10-CM

## 2024-11-19 ENCOUNTER — Ambulatory Visit (INDEPENDENT_AMBULATORY_CARE_PROVIDER_SITE_OTHER): Payer: Self-pay

## 2024-11-19 DIAGNOSIS — E785 Hyperlipidemia, unspecified: Secondary | ICD-10-CM
